# Patient Record
Sex: Female | Born: 2000 | Race: White | Hispanic: No | Marital: Single | Smoking: Never smoker
Health system: Southern US, Community
[De-identification: ages and names within clinical notes are randomized; demographics above are authoritative.]

---

## 2021-06-23 ENCOUNTER — Other Ambulatory Visit: Payer: Self-pay | Admitting: Family Medicine

## 2021-06-23 DIAGNOSIS — R519 Headache, unspecified: Secondary | ICD-10-CM

## 2021-06-26 ENCOUNTER — Other Ambulatory Visit: Payer: Self-pay

## 2021-06-26 ENCOUNTER — Emergency Department (HOSPITAL_BASED_OUTPATIENT_CLINIC_OR_DEPARTMENT_OTHER): Payer: 59

## 2021-06-26 ENCOUNTER — Emergency Department (HOSPITAL_BASED_OUTPATIENT_CLINIC_OR_DEPARTMENT_OTHER)
Admission: EM | Admit: 2021-06-26 | Discharge: 2021-06-27 | Disposition: A | Discharge status: Home / Self Care | Payer: 59 | Attending: Emergency Medicine | Admitting: Emergency Medicine

## 2021-06-26 ENCOUNTER — Encounter (HOSPITAL_BASED_OUTPATIENT_CLINIC_OR_DEPARTMENT_OTHER): Payer: Self-pay | Admitting: Emergency Medicine

## 2021-06-26 DIAGNOSIS — G43909 Migraine, unspecified, not intractable, without status migrainosus: Secondary | ICD-10-CM | POA: Diagnosis not present

## 2021-06-26 DIAGNOSIS — G43809 Other migraine, not intractable, without status migrainosus: Secondary | ICD-10-CM

## 2021-06-26 DIAGNOSIS — H53149 Visual discomfort, unspecified: Secondary | ICD-10-CM | POA: Diagnosis present

## 2021-06-26 LAB — BASIC METABOLIC PANEL
Anion gap: 10 (ref 5–15)
BUN: 14 mg/dL (ref 6–20)
CO2: 26 mmol/L (ref 22–32)
Calcium: 9.3 mg/dL (ref 8.9–10.3)
Chloride: 104 mmol/L (ref 98–111)
Creatinine, Ser: 0.71 mg/dL (ref 0.44–1.00)
GFR, Estimated: >60 mL/min (ref >60)
Glucose, Bld: 84 mg/dL (ref 70–99)
Potassium: 3.2 mmol/L — ABNORMAL LOW (ref 3.5–5.1)
Sodium: 140 mmol/L (ref 135–145)

## 2021-06-26 LAB — CBC WITH DIFFERENTIAL/PLATELET
Abs Immature Granulocytes: 0.01 10*3/uL (ref 0.00–0.07)
Basophils Absolute: 0 10*3/uL (ref 0.0–0.1)
Basophils Relative: 0 %
Eosinophils Absolute: 0.1 10*3/uL (ref 0.0–0.5)
Eosinophils Relative: 1 %
HCT: 36.9 % (ref 36.0–46.0)
Hemoglobin: 12.4 g/dL (ref 12.0–15.0)
Immature Granulocytes: 0 %
Lymphocytes Relative: 26 %
Lymphs Abs: 1.8 10*3/uL (ref 0.7–4.0)
MCH: 27.9 pg (ref 26.0–34.0)
MCHC: 33.6 g/dL (ref 30.0–36.0)
MCV: 82.9 fL (ref 80.0–100.0)
Monocytes Absolute: 0.8 10*3/uL (ref 0.1–1.0)
Monocytes Relative: 12 %
Neutro Abs: 4 10*3/uL (ref 1.7–7.7)
Neutrophils Relative %: 61 %
Platelets: 207 10*3/uL (ref 150–400)
RBC: 4.45 MIL/uL (ref 3.87–5.11)
RDW: 13.3 % (ref 11.5–15.5)
WBC: 6.7 10*3/uL (ref 4.0–10.5)
nRBC: 0 % (ref 0.0–0.2)

## 2021-06-26 MED ORDER — KETOROLAC TROMETHAMINE 30 MG/ML IJ SOLN
30.0000 mg | Freq: Once | INTRAMUSCULAR | Status: AC
  Administered 2021-06-26: 30 mg via INTRAVENOUS
  Filled 2021-06-26: qty 1

## 2021-06-26 MED ORDER — SODIUM CHLORIDE 0.9 % IV BOLUS
1000.0000 mL | Freq: Once | INTRAVENOUS | Status: AC
  Administered 2021-06-26: 1000 mL via INTRAVENOUS

## 2021-06-26 MED ORDER — METOCLOPRAMIDE HCL 5 MG/ML IJ SOLN
10.0000 mg | Freq: Once | INTRAMUSCULAR | Status: AC
  Administered 2021-06-26: 10 mg via INTRAVENOUS
  Filled 2021-06-26: qty 2

## 2021-06-26 NOTE — ED Provider Notes (Signed)
[facility] EMERGENCY DEPARTMENT Provider Note  CSN: [id number] Arrival date & time: 06/26/21 1938    History Chief Complaint  Patient presents with  . Migraine    Debbie Lewis is a 20 y.o. female with remote history of migraines triggered by celiac disease that resolved after she stopped eating gluten. She reports a daily headache for the last 2 months, mostly L occipital, waxes and wanes but rarely ever stops. She has associated nausea and photophobia. Not improved with sumatriptan prescribed by her PCP. She denies any fever, cough, congestion, N/V/D. Her prior migraines were different in character from this headache and never lasted as long. Not described as sudden in onset or thunderclap. No 'lightning'/neuropathic type pain.    History reviewed. No pertinent past medical history.  History reviewed. No pertinent surgical history.  History reviewed. No pertinent family history.      Home Medications Prior to Admission medications   Not on File     Allergies    Patient has no known allergies.   Review of Systems   Review of Systems A comprehensive review of systems was completed and negative except as noted in HPI.    Physical Exam BP 117/72 (BP Location: Right Arm)   Pulse 91   Temp 99.1 F (37.3 C) (Oral)   Resp 16   Ht 5\' 7"  (1.702 m)   Wt 61.2 kg   LMP 06/08/2021 (Within Days)   SpO2 100%   BMI 21.14 kg/m   Physical Exam Vitals and nursing note reviewed.  Constitutional:      Appearance: Normal appearance.  HENT:     Head: Normocephalic and atraumatic.     Nose: Nose normal.     Mouth/Throat:     Mouth: Mucous membranes are moist.  Eyes:     Extraocular Movements: Extraocular movements intact.     Conjunctiva/sclera: Conjunctivae normal.  Cardiovascular:     Rate and Rhythm: Normal rate.  Pulmonary:     Effort: Pulmonary effort is normal.     Breath sounds: Normal breath sounds.  Abdominal:     General: Abdomen is flat.      Palpations: Abdomen is soft.     Tenderness: There is no abdominal tenderness.  Musculoskeletal:        General: No swelling. Normal range of motion.     Cervical back: Neck supple.  Skin:    General: Skin is warm and dry.  Neurological:     General: No focal deficit present.     Mental Status: She is alert.     Cranial Nerves: No cranial nerve deficit.     Sensory: No sensory deficit.     Motor: No weakness.  Psychiatric:        Mood and Affect: Mood normal.     ED Results / Procedures / Treatments   Labs (all labs ordered are listed, but only abnormal results are displayed) Labs Reviewed  BASIC METABOLIC PANEL  CBC WITH DIFFERENTIAL/PLATELET  URINALYSIS, ROUTINE W REFLEX MICROSCOPIC  PREGNANCY, URINE    EKG None   Radiology No results found.  Procedures Procedures  Medications Ordered in the ED Medications  sodium chloride 0.9 % bolus 1,000 mL (has no administration in time range)     MDM Rules/Calculators/A&P MDM Patient with atypical daily headache now for the last 2 months. Has not had prior neuro imaging. Will check basic labs, send for CT and give IVF. Meds depending on results.   ED Course  I have reviewed  the triage vital signs and the nursing notes.  Pertinent labs & imaging results that were available during my care of the patient were reviewed by me and considered in my medical decision making (see chart for details).  Clinical Course as of 06/26/21 2316  Mon Jun 26, 2021  2300 CT head is neg.  [CS]  2312 Care of the patient signed out to Dr. Blinda Leatherwood at the change of shift.  [CS]    Clinical Course User Index [CS] Pollyann Savoy, MD    Final Clinical Impression(s) / ED Diagnoses Final diagnoses:  None    Rx / DC Orders ED Discharge Orders     None        Pollyann Savoy, MD 06/26/21 2316

## 2021-06-26 NOTE — ED Triage Notes (Signed)
Pt presents to ED POV. Pt c/o migraine on back L x71m. Pt also reports dizziness, nausea. Pt reports that she has been taking home meds w/o relief.

## 2021-06-27 LAB — URINALYSIS, ROUTINE W REFLEX MICROSCOPIC
Bilirubin Urine: NEGATIVE
Glucose, UA: NEGATIVE mg/dL
Hgb urine dipstick: NEGATIVE
Ketones, ur: NEGATIVE mg/dL
Leukocytes,Ua: NEGATIVE
Nitrite: NEGATIVE
Specific Gravity, Urine: 1.034 — ABNORMAL HIGH (ref 1.005–1.030)
pH: 5.5 (ref 5.0–8.0)

## 2021-06-27 LAB — PREGNANCY, URINE: Preg Test, Ur: NEGATIVE

## 2021-06-27 NOTE — ED Provider Notes (Signed)
Patient signed out to me by Dr. Bernette Mayers.  Patient seen for headache which was felt to likely be migraine headache.  She did get migraines when she was younger but has not had one in a while.  Her work-up has been unremarkable.  This includes lab work and head CT.  Upon recheck she reports resolution of the headache with appropriate migraine treatment.  Will discharge, referral for neurology.   Gilda Crease, MD 06/27/21 [phone]

## 2021-06-27 NOTE — ED Notes (Signed)
Patient transported to X-ray 

## 2021-06-27 NOTE — ED Notes (Signed)
ED Provider at bedside. 
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
## 2021-06-29 ENCOUNTER — Encounter: Payer: Self-pay | Admitting: Psychiatry

## 2021-06-29 ENCOUNTER — Ambulatory Visit (INDEPENDENT_AMBULATORY_CARE_PROVIDER_SITE_OTHER): Payer: 59 | Admitting: Psychiatry

## 2021-06-29 ENCOUNTER — Other Ambulatory Visit: Payer: Self-pay

## 2021-06-29 VITALS — BP 103/66 | HR 80 | Ht 67.0 in | Wt 140.0 lb

## 2021-06-29 DIAGNOSIS — G43709 Chronic migraine without aura, not intractable, without status migrainosus: Secondary | ICD-10-CM

## 2021-06-29 MED ORDER — NARATRIPTAN HCL 2.5 MG PO TABS: 2.5000 mg | ORAL_TABLET | ORAL | 0 refills | Status: DC | PRN

## 2021-06-29 MED ORDER — NARATRIPTAN HCL 2.5 MG PO TABS: 2.5000 mg | ORAL_TABLET | ORAL | 2 refills | Status: DC | PRN

## 2021-06-29 MED ORDER — NORTRIPTYLINE HCL 10 MG PO CAPS: ORAL_CAPSULE | ORAL | 3 refills | Status: DC

## 2021-06-29 NOTE — Patient Instructions (Addendum)
Start nortriptyline 10 mg (1 pill) at bedtime for one week, then increase to 20 mg (2 pills) at bedtime. Side effects may include drowsiness, dizziness, or constipation Start naratriptan 2.5 mg as needed for migraine. Take at the onset of migraine. If headache recurs or does not fully resolve, you may take a second dose after 4 hours. Please avoid taking more than 2 days per week or 10 days per month. Follow up in 3 months

## 2021-07-04 ENCOUNTER — Telehealth: Payer: Self-pay | Admitting: Psychiatry

## 2021-07-04 NOTE — Telephone Encounter (Signed)
LVM for pt to schedule 3 month follow up from new patient appt 9/15 with Dr. Delena Bali

## 2021-07-11 ENCOUNTER — Other Ambulatory Visit: Payer: 59

## 2021-09-26 ENCOUNTER — Ambulatory Visit: Payer: 59 | Admitting: Psychiatry

## 2021-09-26 ENCOUNTER — Ambulatory Visit (INDEPENDENT_AMBULATORY_CARE_PROVIDER_SITE_OTHER): Payer: 59 | Admitting: Psychiatry

## 2021-09-26 ENCOUNTER — Encounter: Payer: Self-pay | Admitting: Psychiatry

## 2021-09-26 VITALS — BP 108/73 | HR 88 | Ht 67.0 in | Wt 145.0 lb

## 2021-09-26 DIAGNOSIS — G43709 Chronic migraine without aura, not intractable, without status migrainosus: Secondary | ICD-10-CM | POA: Diagnosis not present

## 2021-09-26 MED ORDER — NORTRIPTYLINE HCL 10 MG PO CAPS: ORAL_CAPSULE | ORAL | 3 refills | Status: DC

## 2021-09-26 MED ORDER — NARATRIPTAN HCL 2.5 MG PO TABS: 2.5000 mg | ORAL_TABLET | ORAL | 2 refills | Status: AC | PRN

## 2021-09-26 MED ORDER — NORTRIPTYLINE HCL 25 MG PO CAPS: 25.0000 mg | ORAL_CAPSULE | Freq: Every day | ORAL | 3 refills | Status: DC

## 2021-09-26 NOTE — Progress Notes (Signed)
° °  CC:  headaches  Follow-up Visit  Last visit: 06/29/21  Brief HPI: 20 year old female with a history of Celiac disease who follows in clinic for migraines with rare visual aura. Exam also suggestive of left occipital neuralgia.  At her last visit she was taking Imitrex every other day and was counseled on medication overuse headache. She was started on nortriptyline 20 mg QHS for headache prevention. Naratriptan was started for rescue.  Interval History: Her headaches have improved significantly since her last visit. Went from daily headaches to 2 migraines per week. She is tolerating nortriptyline well without side effects. Took naratriptan once which resolved her migraine in 1 hour. Has not needed to take a second dose.  Migraines are worst on Sundays when she sits for a long period of time at church.  Headache days per month: 8 Headache free days per month: 22  Current Headache Regimen: Preventative: nortriptyline 20 mg QHS Abortive: naratriptan 2.5 mg PRN  # of doses of abortive medications per month: 1  Prior Therapies                                  Imitrex 100 mg PRN - nausea Naratriptan 2.5 mg PRN Nortriptyline   Physical Exam:   Vital Signs: BP 108/73    Pulse 88    Ht 5\' 7"  (1.702 m)    Wt 145 lb (65.8 kg)    BMI 22.71 kg/m  GENERAL:  well appearing, in no acute distress, alert  SKIN:  Color, texture, turgor normal. No rashes or lesions HEAD:  Normocephalic/atraumatic. RESP: normal respiratory effort MSK:  No tenderness to palpation over occiput, neck, or shoulders  NEUROLOGICAL: Mental Status: Alert, oriented to person, place and time, Follows commands, and Speech fluent and appropriate. Cranial Nerves: PERRL, face symmetric, no dysarthria, hearing grossly intact Motor: moves all extremities equally Gait: normal-based.  IMPRESSION: 20 year old female with a history of Celiac disease who presents for follow up of migraines. Her headaches have decreased  significantly on nortriptyline though she continues to have 2 migraines per week. Will increase nortriptyline to 35 mg QHS. Encouraged her to use naratriptan for her migraines as long as she is limiting to 2 days per week. She would like to see if changing her diet helps reduce her headaches. List of common food triggers provided.  PLAN: -Preventive: Increase nortriptyline to 35 mg QHS -Rescue: Continue naratriptan 2.5 mg PRN -Patient will be moving soon and will likely establish care locally. May return to clinic here as needed -Next steps: Consider topamax or propranolol, neck pt, muscle relaxer  Follow-up: as needed  I spent a total of 24 minutes on the date of the service.  Discussed lifestyle modification including increased oral hydration, decreased caffeine, and diet modification. Discussed treatment options including preventive and acute medications, and natural supplements. Discussed medication overuse headache and to limit use of acute treatments to no more than 2 days/week or 10 days/month. Discussed medication side effects, adverse reactions and drug interactions. Written educational materials and patient instructions outlining all of the above were given.  26, MD 09/26/21 2:55 PM

## 2021-09-26 NOTE — Patient Instructions (Addendum)
Increase nortriptyline to 35 mg at bedtime (one 10 mg pill and one 25 mg pill)    Natural supplements: Magnesium Oxide or Magnesium Glycinate 500 mg at bed (up to 800 mg daily) Coenzyme Q10 300 mg in AM Vitamin B2- 200 mg twice a day  Add 1 supplement at a time since even natural supplements can have undesirable side effects. You can sometimes buy supplements cheaper (especially Coenzyme Q10) at www.WebmailGuide.co.za or at [employer].  Vitamins and herbs that show potential:  Magnesium: Magnesium (250 mg twice a day or 500 mg at bed) has a relaxant effect on smooth muscles such as blood vessels. Individuals suffering from frequent or daily headache usually have low magnesium levels which can be increase with daily supplementation of 400-750 mg. Three trials found 40-90% average headache reduction  when used as a preventative. Magnesium also demonstrated the benefit in menstrually related migraine.  Magnesium is part of the messenger system in the serotonin cascade and it is a good muscle relaxant.  It is also useful for constipation which can be a side effect of other medications used to treat migraine. Good sources include nuts, whole grains, and tomatoes. Side Effects: loose stool/diarrhea Riboflavin (vitamin B 2) 200 mg twice a day. This vitamin assists nerve cells in the production of ATP a principal energy storing molecule.  It is necessary for many chemical reactions in the body.  There have been at least 3 clinical trials of riboflavin using 400 mg per day all of which suggested that migraine frequency can be decreased.  All 3 trials showed significant improvement in over half of migraine sufferers.  The supplement is found in bread, cereal, milk, meat, and poultry.  Most Americans get more riboflavin than the recommended daily allowance, however riboflavin deficiency is not necessary for the supplements to help prevent headache. Side effects: energizing, green urine  Coenzyme Q10: This is present in  almost all cells in the body and is critical component for the conversion of energy.  Recent studies have shown that a nutritional supplement of CoQ10 can reduce the frequency of migraine attacks by improving the energy production of cells as with riboflavin.  Doses of 150 mg twice a day have been shown to be effective.  Melatonin: Increasing evidence shows correlation between melatonin secretion and headache conditions.  Melatonin supplementation has decreased headache intensity and duration.  It is widely used as a sleep aid.  Sleep is natures way of dealing with migraine.  A dose of 3 mg is recommended to start for headaches including cluster headache. Higher doses up to 15 mg has been reviewed for use in Cluster headache and have been used. The rationale behind using melatonin for cluster is that many theories regarding the cause of Cluster headache center around the disruption of the normal circadian rhythm in the brain.  This helps restore the normal circadian rhythm.  Ginger: Ginger has a small amount of antihistamine and anti-inflammatory action which may help headache.  It is primarily used for nausea and may aid in the absorption of other medications.  HEADACHE DIET: Foods and beverages which may trigger migraine Note that only 20% of headache patients are food sensitive. You will know if you are food sensitive if you get a headache consistently 20 minutes to 2 hours after eating a certain food. Only cut out a food if it causes headaches, otherwise you might remove foods you enjoy! What matters most for diet is to eat a well balanced healthy diet full of  vegetables and low fat protein, and to not miss meals.  Chocolate, other sweets ALL cheeses except cottage and cream cheese Dairy products, yogurt, sour cream, ice cream Liver Meat extracts (Bovril, Marmite, meat tenderizers) Meats or fish which have undergone aging, fermenting, pickling or smoking. These include:  Hotdogs,salami,Lox,sausage, mortadellas,smoked salmon, pepperoni, Pickled herring Pods of broad bean (English beans, Chinese pea pods, [place] (fava) beans, lima and navy beans Ripe avocado, ripe banana Yeast extracts or active yeast preparations such as Brewer's or Fleishman's (commercial bakes goods are permitted) Tomato based foods, pizza (lasagna, etc.)  MSG (monosodium glutamate) is disguised as many things; look for these common aliases: Monopotassium glutamate Autolysed yeast Hydrolysed protein Sodium caseinate flavorings all natural preservatives" Nutrasweet  Avoid all other foods that convincingly provoke headaches.  Resources: The Dizzy [place] Laundry Your Headache Diet, migrainestrong.com  https://zamora-andrews.com/  Caffeine and Migraine For patients that have migraine, caffeine intake more than 3 days per week can lead to dependency and increased migraine frequency. I would recommend cutting back on your caffeine intake as best you can. The recommended amount of caffeine is 200-300 mg daily, although migraine patients may experience dependency at even lower doses. While you may notice an increase in headache temporarily, cutting back will be helpful for headaches in the long run. For more information on caffeine and migraine, visit: https://americanmigrainefoundation.org/resource-library/caffeine-and-migraine/

## 2021-09-28 ENCOUNTER — Ambulatory Visit: Payer: 59 | Admitting: Psychiatry

## 2021-11-16 ENCOUNTER — Encounter: Payer: Self-pay | Admitting: Psychiatry

## 2021-11-16 ENCOUNTER — Ambulatory Visit (INDEPENDENT_AMBULATORY_CARE_PROVIDER_SITE_OTHER): Payer: 59 | Admitting: Psychiatry

## 2021-11-16 VITALS — BP 117/77 | HR 102 | Ht 67.0 in | Wt 142.0 lb

## 2021-11-16 DIAGNOSIS — G43009 Migraine without aura, not intractable, without status migrainosus: Secondary | ICD-10-CM | POA: Diagnosis not present

## 2021-11-16 MED ORDER — NORTRIPTYLINE HCL 50 MG PO CAPS: 50.0000 mg | ORAL_CAPSULE | Freq: Every day | ORAL | 11 refills | Status: DC

## 2021-11-16 MED ORDER — METHYLPREDNISOLONE 4 MG PO TBPK: ORAL_TABLET | ORAL | 0 refills | Status: AC

## 2021-11-16 NOTE — Patient Instructions (Signed)
Increase nortriptyline to 50 mg at bedtime  Steroid pack to help break headache cycle

## 2021-11-16 NOTE — Progress Notes (Signed)
° °  CC:  headaches  Follow-up Visit  Last visit: 09/26/21  Brief HPI: 21 year old female with a history of Celiac disease who follows in clinic for migraines.  At her last visit she was noted to be overusing OTCs and was counseled on limiting rescue medication. Nortriptyline was increased to 35 mg QHS.  Interval History: She has noticed an increase in migraine frequency and severity over the past 2 weeks. Notes headaches are worse when she exerts herself. Only has a car every other week and feels worsening of her headaches when she has to use a bike to get around. She moved to [place] recently.  Naratriptan continues to work well for rescue. She has not had any side effects with nortriptyline.  Headache days per month: 14 Headache free days per month: 16  Current Headache Regimen: Preventative: nortriptyline 35 mg QHS Abortive: naratriptan 2.5 mg PRN   Prior Therapies                                  Imitrex 100 mg PRN - nausea Naratriptan 2.5 mg PRN Nortriptyline 35 mg QHS  Physical Exam:   Vital Signs: BP 117/77    Pulse (!) 102    Ht 5\' 7"  (1.702 m)    Wt 142 lb (64.4 kg)    BMI 22.24 kg/m  GENERAL:  well appearing, in no acute distress, alert  SKIN:  Color, texture, turgor normal. No rashes or lesions HEAD:  Normocephalic/atraumatic. RESP: normal respiratory effort MSK:  No gross joint deformities.   NEUROLOGICAL: Mental Status: Alert, oriented to person, place and time, Follows commands, and Speech fluent and appropriate. Cranial Nerves: PERRL, face symmetric, no dysarthria, hearing grossly intact Motor: moves all extremities equally Gait: normal-based.  IMPRESSION: 21 year old female with a history of Celiac disease who presents for follow up of migraines. She had been doing well until about 2 weeks ago when her headaches began to increase in frequency. Medrol dosepak prescribed to help break current headache cycle. Will increase nortriptyline to 50 mg  QHS. Naratriptan continues to work well for rescue.   PLAN: -Medrol dosepak -Preventive: Increase nortriptyline to 50 mg QHS -Rescue: Continue naratriptan 2.5 mg PRN -Next steps: Consider topamax or propranolol, neck PT  Follow-up: 3 months  I spent a total of 20 minutes on the date of the service. Discussed medication side effects, adverse reactions and drug interactions. Written educational materials and patient instructions outlining all of the above were given.  26, MD 11/16/21 1:57 PM

## 2022-01-05 ENCOUNTER — Telehealth: Payer: Self-pay | Admitting: Psychiatry

## 2022-01-05 NOTE — Telephone Encounter (Signed)
Pt is asking her pharmacy for refills now be [facility] [address] [place], [place] [phone] ?

## 2022-01-05 NOTE — Telephone Encounter (Signed)
Pt is asking her pharmacy for refills now be [facility] [address] [place], [place] ?

## 2022-01-08 NOTE — Telephone Encounter (Signed)
Noted, pharmacy changed.

## 2022-01-22 ENCOUNTER — Telehealth: Payer: Self-pay | Admitting: Psychiatry

## 2022-01-22 NOTE — Telephone Encounter (Signed)
Pt is requesting a refill for nortriptyline (PAMELOR) 50 MG capsule. ? ?[facility] [phone] [facility], [place]  ? ?

## 2022-01-22 NOTE — Telephone Encounter (Signed)
PHONE RM CAN RELAY IF PT CALLS BACK ? ?Attempted to contact pt both home and cell numbers and reached other people. A yrs worth of Nortriptyline was provided 11/16/21 but it went to [place], [place]. Pt can rq pharmacy [facility] that is what she wants  ?

## 2022-01-24 ENCOUNTER — Other Ambulatory Visit: Payer: Self-pay

## 2022-01-24 MED ORDER — NORTRIPTYLINE HCL 50 MG PO CAPS: 50.0000 mg | ORAL_CAPSULE | Freq: Every day | ORAL | 11 refills | Status: AC

## 2022-01-24 NOTE — Telephone Encounter (Signed)
Pt requesting [facility] [address] [place]. Pt would like to know if she needs to call the pharmacy or if the nurse will be calling, pt would like a call back.  ?[phone] ?

## 2022-01-24 NOTE — Telephone Encounter (Signed)
Previous rx discontinued and reordered [facility] [address] [place] ?

## 2022-10-02 IMAGING — CT CT HEAD W/O CM
4 series · 17 of 47 positions shown, 19 images · non-contrast
Comparison: None.

CLINICAL DATA: Headache with dizziness and nausea.

EXAM:
CT HEAD WITHOUT CONTRAST
TECHNIQUE: Contiguous axial images were obtained from the base of the skull
through the vertex without intravenous contrast.

[Series 2: head wo · axial · 0.41mm/px · z∈[-210,-90]mm · 7 of 32 slices shown, 9 images]
[im 4/32  brain]
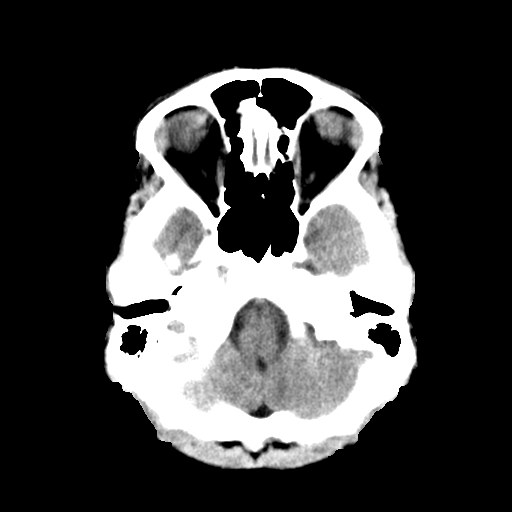
[im 4/32  bone]
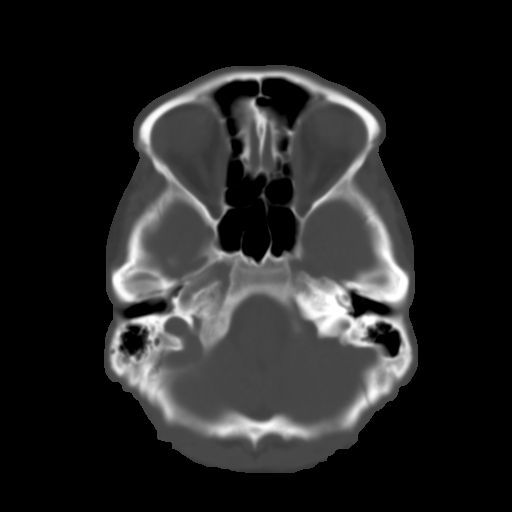
[im 8/32  brain]
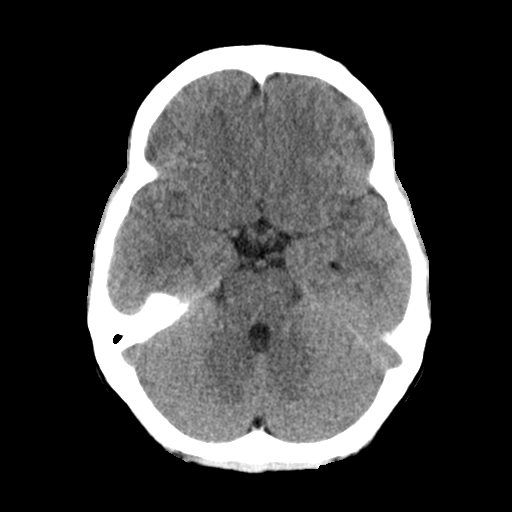
[im 12/32  brain]
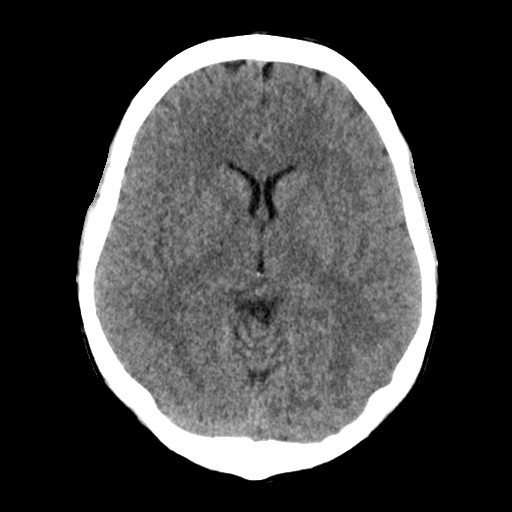
[im 16/32  brain]
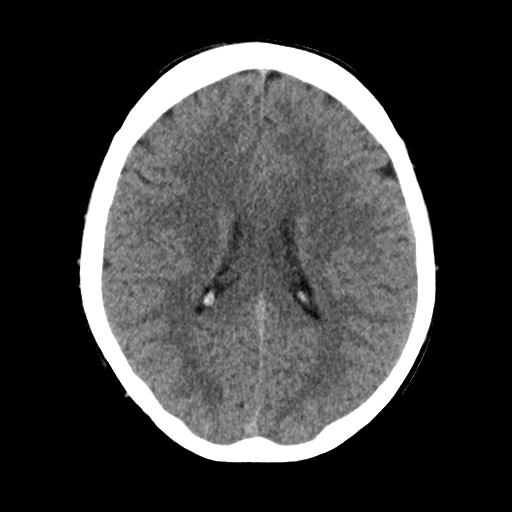
[im 20/32  brain]
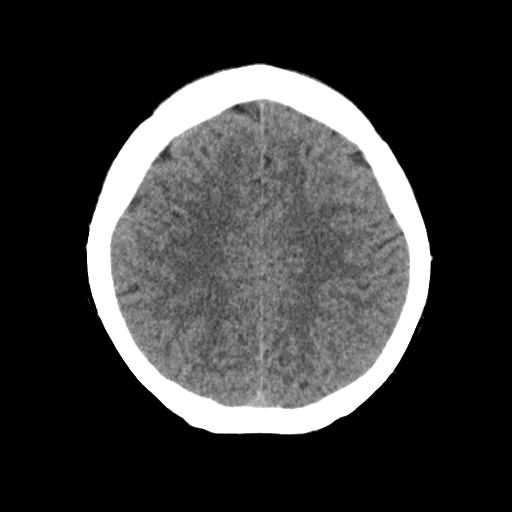
[im 20/32  bone]
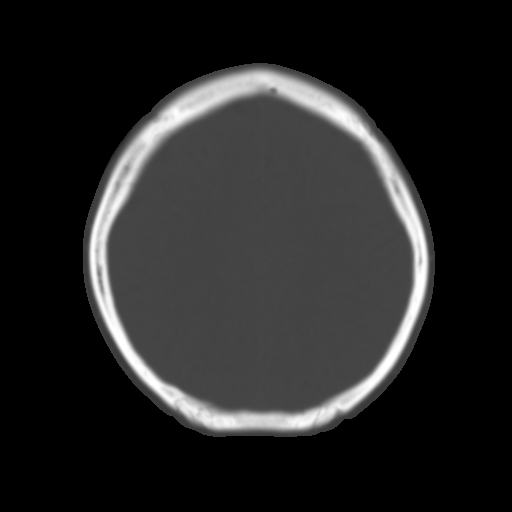
[im 24/32  brain]
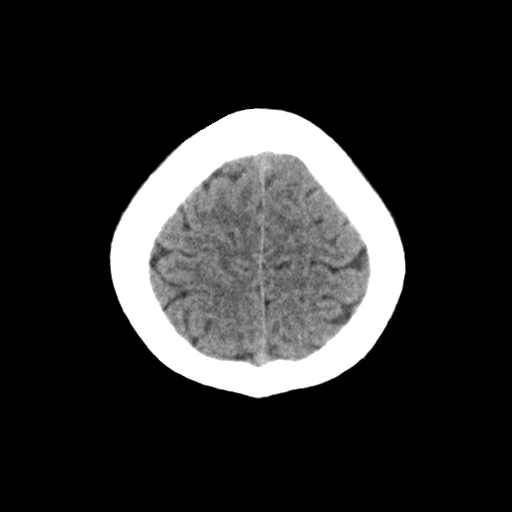
[im 28/32  brain]
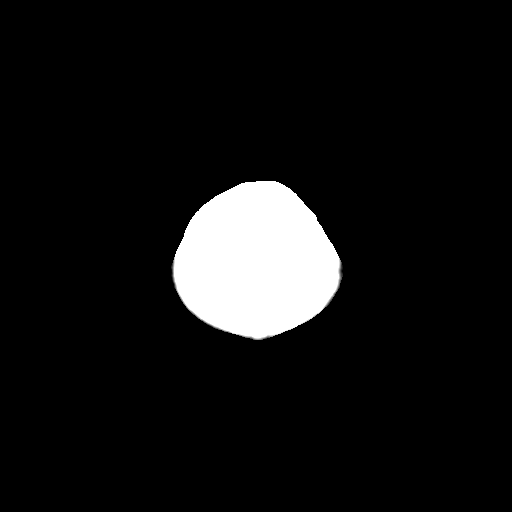

[Series 3: head bone · axial · 0.41mm/px · z∈[-211,-157]mm · 4 of 78 slices shown]
[im 8/78  bone]
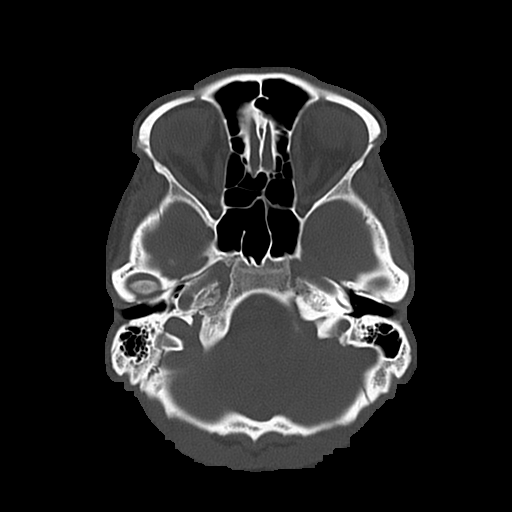
[im 16/78  bone]
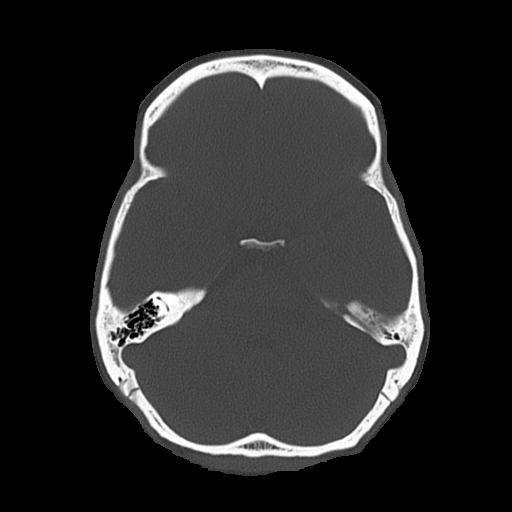
[im 24/78  bone]
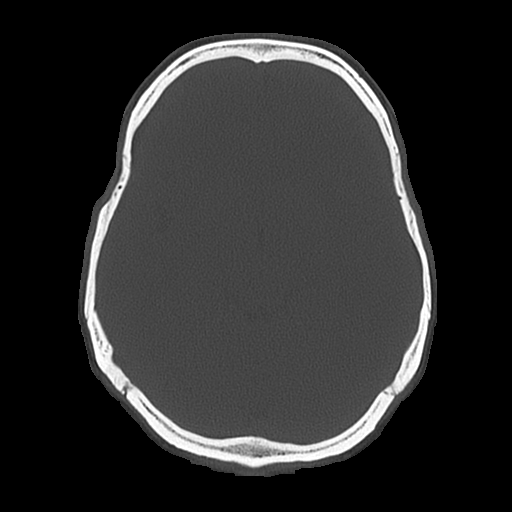
[im 35/78  bone]
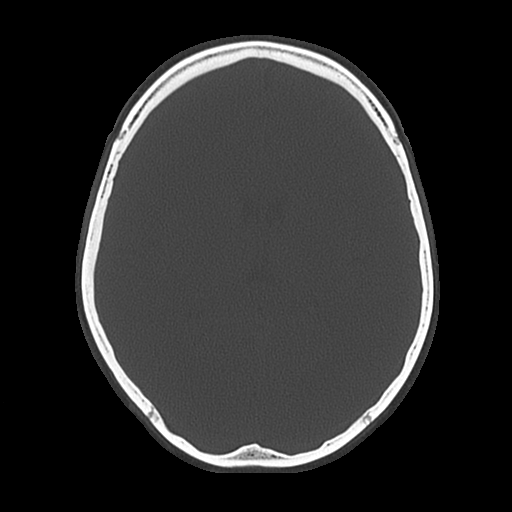

[Series 4: coronal soft · coronal · 0.31mm/px · 3 of 63 slices shown]
[im 21/63  brain]
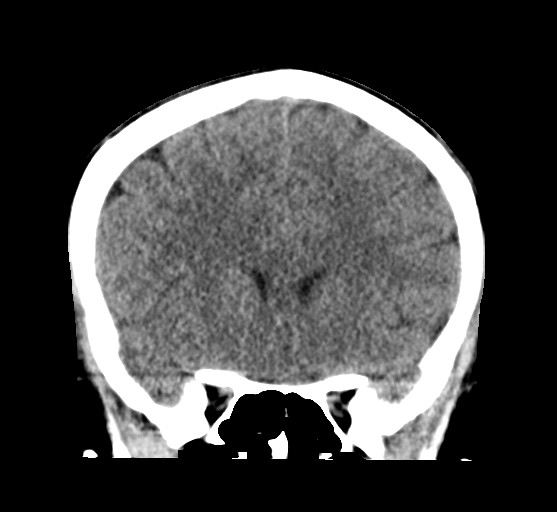
[im 28/63  brain]
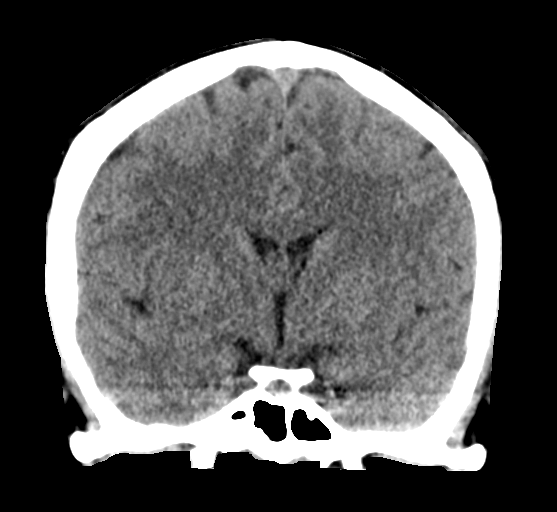
[im 35/63  brain]
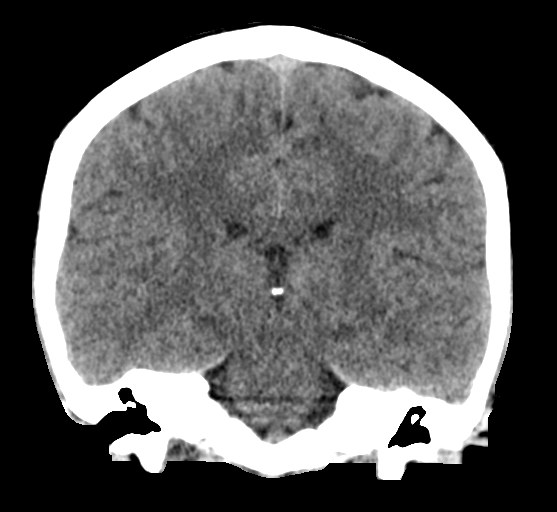

[Series 5: sagittal soft · sagittal · 0.32mm/px · 3 of 53 slices shown]
[im 18/53  brain]
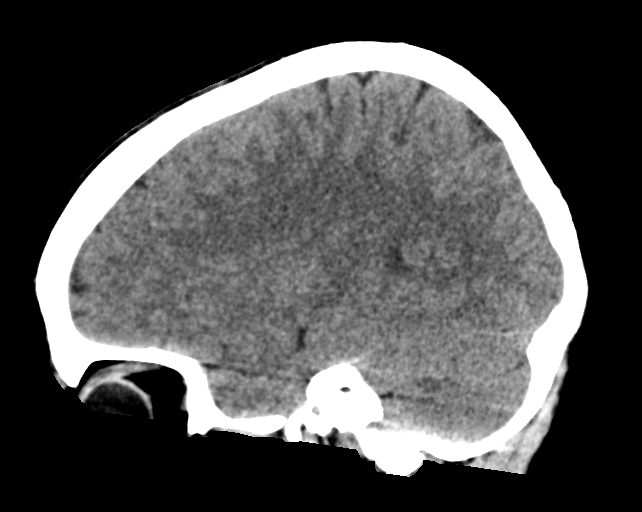
[im 27/53  brain]
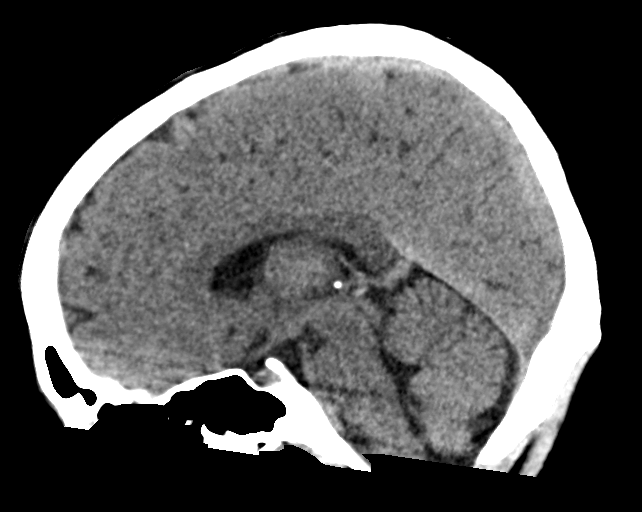
[im 35/53  brain]
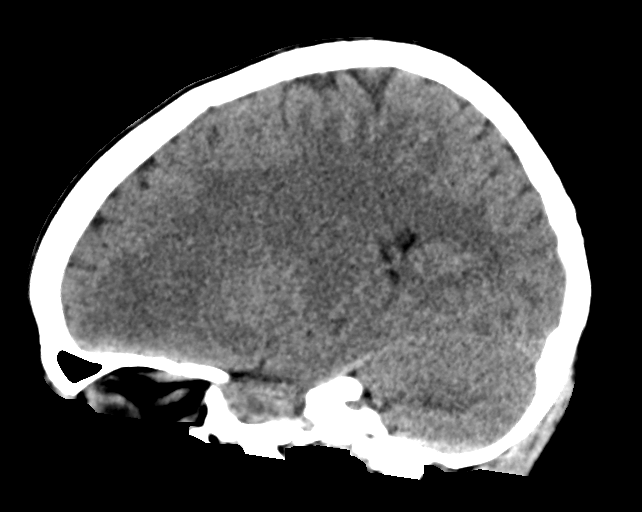

[17 of 47 positions shown; findings below may reference images not displayed]

FINDINGS: Brain: No evidence of acute infarction, hemorrhage, hydrocephalus,
extra-axial collection or mass lesion/mass effect.

Vascular: No hyperdense vessel or unexpected calcification.

Skull: Normal. Negative for fracture or focal lesion.

Sinuses/Orbits: No acute finding.

Other: None.
IMPRESSION: No acute intracranial pathology.
